# Patient Record
Sex: Female | Born: 1985 | Race: White | Hispanic: No | Marital: Single | Smoking: Current every day smoker
Health system: Southern US, Community
[De-identification: ages and names within clinical notes are randomized; demographics above are authoritative.]

---
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
## 2015-09-27 ENCOUNTER — Emergency Department (HOSPITAL_BASED_OUTPATIENT_CLINIC_OR_DEPARTMENT_OTHER): Payer: Self-pay

## 2015-09-27 ENCOUNTER — Encounter (HOSPITAL_BASED_OUTPATIENT_CLINIC_OR_DEPARTMENT_OTHER): Payer: Self-pay | Admitting: *Deleted

## 2015-09-27 ENCOUNTER — Emergency Department (HOSPITAL_BASED_OUTPATIENT_CLINIC_OR_DEPARTMENT_OTHER)
Admission: EM | Admit: 2015-09-27 | Discharge: 2015-09-27 | Disposition: A | Discharge status: Home / Self Care | Payer: Self-pay | Attending: Emergency Medicine | Admitting: Emergency Medicine

## 2015-09-27 DIAGNOSIS — N760 Acute vaginitis: Secondary | ICD-10-CM | POA: Insufficient documentation

## 2015-09-27 DIAGNOSIS — N390 Urinary tract infection, site not specified: Secondary | ICD-10-CM | POA: Insufficient documentation

## 2015-09-27 DIAGNOSIS — B9689 Other specified bacterial agents as the cause of diseases classified elsewhere: Secondary | ICD-10-CM

## 2015-09-27 DIAGNOSIS — R102 Pelvic and perineal pain: Secondary | ICD-10-CM

## 2015-09-27 DIAGNOSIS — F17210 Nicotine dependence, cigarettes, uncomplicated: Secondary | ICD-10-CM | POA: Insufficient documentation

## 2015-09-27 LAB — URINALYSIS, ROUTINE W REFLEX MICROSCOPIC
Bilirubin Urine: NEGATIVE
GLUCOSE, UA: NEGATIVE mg/dL
KETONES UR: NEGATIVE mg/dL
Nitrite: POSITIVE — AB
PH: 6.5 (ref 5.0–8.0)
PROTEIN: NEGATIVE mg/dL
Specific Gravity, Urine: 1.027 (ref 1.005–1.030)

## 2015-09-27 LAB — COMPREHENSIVE METABOLIC PANEL
ALK PHOS: 46 U/L (ref 38–126)
ALT: 9 U/L — AB (ref 14–54)
AST: 14 U/L — ABNORMAL LOW (ref 15–41)
Albumin: 4.1 g/dL (ref 3.5–5.0)
Anion gap: 6 (ref 5–15)
BILIRUBIN TOTAL: 0.4 mg/dL (ref 0.3–1.2)
BUN: 11 mg/dL (ref 6–20)
CALCIUM: 9.1 mg/dL (ref 8.9–10.3)
CHLORIDE: 107 mmol/L (ref 101–111)
CO2: 23 mmol/L (ref 22–32)
CREATININE: 0.59 mg/dL (ref 0.44–1.00)
Glucose, Bld: 91 mg/dL (ref 65–99)
Potassium: 3.8 mmol/L (ref 3.5–5.1)
Sodium: 136 mmol/L (ref 135–145)
TOTAL PROTEIN: 6.7 g/dL (ref 6.5–8.1)

## 2015-09-27 LAB — CBC WITH DIFFERENTIAL/PLATELET
Basophils Absolute: 0 10*3/uL (ref 0.0–0.1)
Basophils Relative: 0 %
EOS PCT: 1 %
Eosinophils Absolute: 0.1 10*3/uL (ref 0.0–0.7)
HEMATOCRIT: 39.2 % (ref 36.0–46.0)
Hemoglobin: 13.7 g/dL (ref 12.0–15.0)
LYMPHS ABS: 2.2 10*3/uL (ref 0.7–4.0)
LYMPHS PCT: 17 %
MCH: 34.3 pg — AB (ref 26.0–34.0)
MCHC: 34.9 g/dL (ref 30.0–36.0)
MCV: 98.2 fL (ref 78.0–100.0)
Monocytes Absolute: 0.9 10*3/uL (ref 0.1–1.0)
Monocytes Relative: 7 %
Neutro Abs: 9.5 10*3/uL — ABNORMAL HIGH (ref 1.7–7.7)
Neutrophils Relative %: 75 %
PLATELETS: 256 10*3/uL (ref 150–400)
RBC: 3.99 MIL/uL (ref 3.87–5.11)
RDW: 11.9 % (ref 11.5–15.5)
WBC: 12.7 10*3/uL — AB (ref 4.0–10.5)

## 2015-09-27 LAB — WET PREP, GENITAL
Sperm: NONE SEEN
Trich, Wet Prep: NONE SEEN
YEAST WET PREP: NONE SEEN

## 2015-09-27 LAB — URINE MICROSCOPIC-ADD ON

## 2015-09-27 LAB — LIPASE, BLOOD: LIPASE: 34 U/L (ref 11–51)

## 2015-09-27 LAB — PREGNANCY, URINE: Preg Test, Ur: NEGATIVE

## 2015-09-27 MED ORDER — METRONIDAZOLE 500 MG PO TABS: 500.0000 mg | ORAL_TABLET | Freq: Two times a day (BID) | ORAL | 0 refills | Status: AC

## 2015-09-27 MED ORDER — PHENAZOPYRIDINE HCL 200 MG PO TABS: 200.0000 mg | ORAL_TABLET | Freq: Three times a day (TID) | ORAL | 0 refills | Status: AC

## 2015-09-27 MED ORDER — CEPHALEXIN 500 MG PO CAPS: 500.0000 mg | ORAL_CAPSULE | Freq: Two times a day (BID) | ORAL | 0 refills | Status: AC

## 2015-09-27 NOTE — ED Provider Notes (Signed)
[facility] Provider Note   CSN: [id number] Arrival date & time: 09/27/15  1916   By signing my name below, I, Christel Mormon, attest that this documentation has been prepared under the direction and in the presence of Melburn Hake, [place]. Electronically Signed: Christel Mormon, Scribe. 09/27/2015. 8:00 PM.   History   Chief Complaint Chief Complaint  Patient presents with  . Abdominal Pain    HPI Kristy Farmer is a 30 y.o. female.  The history is provided by the patient. No language interpreter was used.  HPI Comments:  Kristy Farmer is a 30 y.o. female with PMHx of UTI and yeast infections and PSHx of caesarian section who presents to the Emergency Department complaining of sudden onset, constant, sharp lower abdominal pain x 1 day. Pt complains of associated constipation, vaginal discharge, and dysuria. LNMP was 2 weeks ago. Pt took motrin with some relief but pain has since worsened. Pt notes that she is sexually active with 1 partner and does not uses condoms every time. Pt denies any previous episodes of similar pain. Pt denies fever, nausea, vomiting, diarrhea, constipation and hematuria, flank pain, vaginal bleeding.  History reviewed. No pertinent past medical history.  There are no active problems to display for this patient.   Past Surgical History:  Procedure Laterality Date  . CESAREAN SECTION      OB History    No data available       Home Medications    Prior to Admission medications   Medication Sig Start Date End Date Taking? Authorizing Provider  cephALEXin (KEFLEX) 500 MG capsule Take 1 capsule (500 mg total) by mouth 2 (two) times daily. 09/27/15 10/04/15  Barrett Henle, PA-C  metroNIDAZOLE (FLAGYL) 500 MG tablet Take 1 tablet (500 mg total) by mouth 2 (two) times daily. 09/27/15   Barrett Henle, PA-C  phenazopyridine (PYRIDIUM) 200 MG tablet Take 1 tablet (200 mg total) by mouth 3 (three) times daily. 09/27/15   Barrett Henle, PA-C    Family History No family history on file.  Social History Social History  Substance Use Topics  . Smoking status: Current Every Day Smoker    Packs/day: 0.50    Types: Cigarettes  . Smokeless tobacco: Never Used  . Alcohol use Yes     Allergies   Review of patient's allergies indicates no known allergies.   Review of Systems Review of Systems  Constitutional: Negative for fever.  Gastrointestinal: Positive for abdominal pain. Negative for diarrhea, nausea and vomiting.  Genitourinary: Positive for dysuria and vaginal discharge. Negative for hematuria.  All other systems reviewed and are negative.    Physical Exam Updated Vital Signs BP 106/69 (BP Location: Right Arm)   Pulse 71   Temp 98.5 F (36.9 C) (Oral)   Resp 18   Ht 5\' 2"  (1.575 m)   Wt 49.9 kg   LMP 09/13/2015   SpO2 100%   BMI 20.12 kg/m   Physical Exam  Constitutional: She is oriented to person, place, and time. She appears well-developed and well-nourished. No distress.  HENT:  Head: Normocephalic and atraumatic.  Mouth/Throat: Oropharynx is clear and moist. No oropharyngeal exudate.  Eyes: Conjunctivae and EOM are normal. Right eye exhibits no discharge. Left eye exhibits no discharge. No scleral icterus.  Neck: Normal range of motion. Neck supple.  Cardiovascular: Normal rate, regular rhythm, normal heart sounds and intact distal pulses.   No murmur heard. Pulmonary/Chest: Effort normal and breath sounds normal. No respiratory distress.  She has no wheezes. She has no rales. She exhibits no tenderness.  Abdominal: Soft. Bowel sounds are normal. She exhibits no distension and no mass. There is tenderness. There is no rebound and no guarding. No hernia.  Pt reports mild discomfort with palpation over R and L LQ No CVA tenderness  Genitourinary:  Genitourinary Comments: Mild tenderness to lateral edges of mons pubis. No swelling, erythema, warmth or drainage notes.     Musculoskeletal: She exhibits no edema.  Neurological: She is alert and oriented to person, place, and time.  Skin: Skin is warm and dry.  Psychiatric: She has a normal mood and affect.  Nursing note and vitals reviewed.  Pelvic exam: normal external genitalia, vulva, vagina, cervix, uterus and adnexa, VULVA: normal appearing vulva with no masses, tenderness or lesions, VAGINA: normal appearing vagina with normal color and discharge, no lesions, vaginal discharge - white and malodorous, CERVIX: normal appearing cervix without discharge or lesions, WET MOUNT done - results: clue cells, white blood cells, DNA probe for chlamydia and GC obtained, UTERUS: uterus is normal size, shape, consistency and nontender, ADNEXA: normal adnexa in size, nontender and no masses, tenderness left, exam chaperoned by female tech.   ED Treatments / Results  DIAGNOSTIC STUDIES:  Oxygen Saturation is 100% on RA, normal by my interpretation.    COORDINATION OF CARE:  11:08 PM Discussed treatment plan with pt at bedside and pt agreed to plan.  Labs (all labs ordered are listed, but only abnormal results are displayed) Labs Reviewed  WET PREP, GENITAL - Abnormal; Notable for the following:       Result Value   Clue Cells Wet Prep HPF POC PRESENT (*)    WBC, Wet Prep HPF POC MANY (*)    All other components within normal limits  URINALYSIS, ROUTINE W REFLEX MICROSCOPIC (NOT AT [facility]) - Abnormal; Notable for the following:    APPearance CLOUDY (*)    Hgb urine dipstick SMALL (*)    Nitrite POSITIVE (*)    Leukocytes, UA MODERATE (*)    All other components within normal limits  URINE MICROSCOPIC-ADD ON - Abnormal; Notable for the following:    Squamous Epithelial / LPF 6-30 (*)    Bacteria, UA MANY (*)    All other components within normal limits  CBC WITH DIFFERENTIAL/PLATELET - Abnormal; Notable for the following:    WBC 12.7 (*)    MCH 34.3 (*)    Neutro Abs 9.5 (*)    All other components within  normal limits  COMPREHENSIVE METABOLIC PANEL - Abnormal; Notable for the following:    AST 14 (*)    ALT 9 (*)    All other components within normal limits  PREGNANCY, URINE  LIPASE, BLOOD  RPR  HIV ANTIBODY (ROUTINE TESTING)  GC/CHLAMYDIA PROBE AMP (Advance) NOT AT [facility]    EKG  EKG Interpretation None       Radiology US Transvaginal Non-ob  Result Date: 09/27/2015 CLINICAL DATA:  Bilateral adnexa pain x2 days, left greater than right EXAM: TRANSABDOMINAL AND TRANSVAGINAL ULTRASOUND OF PELVIS DOPPLER ULTRASOUND OF OVARIES TECHNIQUE: Both transabdominal and transvaginal ultrasound examinations of the pelvis were performed. Transabdominal technique was performed for global imaging of the pelvis including uterus, ovaries, adnexal regions, and pelvic cul-de-sac. It was necessary to proceed with endovaginal exam following the transabdominal exam to visualize the endometrium. Color and duplex Doppler ultrasound was utilized to evaluate blood flow to the ovaries. COMPARISON:  None. FINDINGS: Uterus Measurements: 8.0 x 3.7  x 4.5 cm. No fibroids or other mass visualized. Endometrium Thickness: 9 mm.  No focal abnormality visualized. Right ovary Measurements: 3.6 x 2.8 x 3.5 cm. Normal appearance/no adnexal mass. Left ovary Measurements: 3.1 x 1.8 x 2.1 cm. Normal appearance/no adnexal mass. Pulsed Doppler evaluation of both ovaries demonstrates normal low-resistance arterial and venous waveforms. Other findings Trace pelvic fluid, likely physiologic. IMPRESSION: Negative pelvic ultrasound. No evidence of ovarian torsion. Electronically Signed   By: Charline Bills M.D.   On: 09/27/2015 22:36   US Pelvis Complete  Result Date: 09/27/2015 CLINICAL DATA:  Bilateral adnexa pain x2 days, left greater than right EXAM: TRANSABDOMINAL AND TRANSVAGINAL ULTRASOUND OF PELVIS DOPPLER ULTRASOUND OF OVARIES TECHNIQUE: Both transabdominal and transvaginal ultrasound examinations of the pelvis were performed.  Transabdominal technique was performed for global imaging of the pelvis including uterus, ovaries, adnexal regions, and pelvic cul-de-sac. It was necessary to proceed with endovaginal exam following the transabdominal exam to visualize the endometrium. Color and duplex Doppler ultrasound was utilized to evaluate blood flow to the ovaries. COMPARISON:  None. FINDINGS: Uterus Measurements: 8.0 x 3.7 x 4.5 cm. No fibroids or other mass visualized. Endometrium Thickness: 9 mm.  No focal abnormality visualized. Right ovary Measurements: 3.6 x 2.8 x 3.5 cm. Normal appearance/no adnexal mass. Left ovary Measurements: 3.1 x 1.8 x 2.1 cm. Normal appearance/no adnexal mass. Pulsed Doppler evaluation of both ovaries demonstrates normal low-resistance arterial and venous waveforms. Other findings Trace pelvic fluid, likely physiologic. IMPRESSION: Negative pelvic ultrasound. No evidence of ovarian torsion. Electronically Signed   By: Charline Bills M.D.   On: 09/27/2015 22:36   [place] Art/ven Flow Abd Pelv Doppler  Result Date: 09/27/2015 CLINICAL DATA:  Bilateral adnexa pain x2 days, left greater than right EXAM: TRANSABDOMINAL AND TRANSVAGINAL ULTRASOUND OF PELVIS DOPPLER ULTRASOUND OF OVARIES TECHNIQUE: Both transabdominal and transvaginal ultrasound examinations of the pelvis were performed. Transabdominal technique was performed for global imaging of the pelvis including uterus, ovaries, adnexal regions, and pelvic cul-de-sac. It was necessary to proceed with endovaginal exam following the transabdominal exam to visualize the endometrium. Color and duplex Doppler ultrasound was utilized to evaluate blood flow to the ovaries. COMPARISON:  None. FINDINGS: Uterus Measurements: 8.0 x 3.7 x 4.5 cm. No fibroids or other mass visualized. Endometrium Thickness: 9 mm.  No focal abnormality visualized. Right ovary Measurements: 3.6 x 2.8 x 3.5 cm. Normal appearance/no adnexal mass. Left ovary Measurements: 3.1 x 1.8 x 2.1 cm.  Normal appearance/no adnexal mass. Pulsed Doppler evaluation of both ovaries demonstrates normal low-resistance arterial and venous waveforms. Other findings Trace pelvic fluid, likely physiologic. IMPRESSION: Negative pelvic ultrasound. No evidence of ovarian torsion. Electronically Signed   By: Charline Bills M.D.   On: 09/27/2015 22:36    Procedures Procedures (including critical care time)  Medications Ordered in ED Medications - No data to display   Initial Impression / Assessment and Plan / ED Course  I have reviewed the triage vital signs and the nursing notes.  Pertinent labs & imaging results that were available during my care of the patient were reviewed by me and considered in my medical decision making (see chart for details).  Clinical Course    Patient presents with lower abdominal pain, dysuria and vaginal discharge that has been present for the past 2 days. Denies fever or vomiting. VSS. Exam revealed tenderness in right and left lower quadrants, no peritoneal signs. Pelvic exam revealed left adnexal tenderness and white malodorous discharge in vaginal vault, no CMT. Patient declined  any pain medications.  Pregnancy negative. WBC 12.7. UA consistent with UTI. Wet prep positive for clue cells and WBCs. Due to patient having left adnexal tenderness on exam will order a pelvic ultrasound for further evaluation and concern for ovarian torsion. Pelvic ultrasound and Doppler unremarkable. On reevaluation patient is resting comfortably in room and denies any pain or complaints at this time. Suspect patient's symptoms are likely due to her UTI and BV and do not feel any further workup or imaging is warranted at this time. Plan to discharge patient home with Flagyl to treat BV and Keflex for UTI. Advised patient about her pending labs for chlamydia, gonorrhea, HIV and syphilis. Advised patient to follow up with PCP as needed. Discussed strict return precautions with patient.  Final  Clinical Impressions(s) / ED Diagnoses   Final diagnoses:  Left adnexal tenderness  UTI (lower urinary tract infection)  BV (bacterial vaginosis)    New Prescriptions New Prescriptions   CEPHALEXIN (KEFLEX) 500 MG CAPSULE    Take 1 capsule (500 mg total) by mouth 2 (two) times daily.   METRONIDAZOLE (FLAGYL) 500 MG TABLET    Take 1 tablet (500 mg total) by mouth 2 (two) times daily.   PHENAZOPYRIDINE (PYRIDIUM) 200 MG TABLET    Take 1 tablet (200 mg total) by mouth 3 (three) times daily.   I personally performed the services described in this documentation, which was scribed in my presence. The recorded information has been reviewed and is accurate.     Satira Sarkicole Elizabeth MillstoneNadeau, [place]-C 09/27/15 2313    Jacalyn LefevreJulie Haviland, MD 10/02/15 [phone]

## 2015-09-27 NOTE — Discharge Instructions (Signed)
Take Benadryl antibiotics as prescribed until completed. Continue drinking fluids at home determining hydrated. Please follow up with a primary care provider from the Resource Guide provided below in the next week if your symptoms are not improved. Please return to the Emergency Department if symptoms worsen or new onset of fever, new/worsening abdominal pain, vomiting, unable to keep fluids down, blood in urine or stool, difficulty urinating, flank pain.

## 2015-09-27 NOTE — ED Triage Notes (Signed)
Lower abdominal pain. Dysuria.

## 2015-09-29 LAB — HIV ANTIBODY (ROUTINE TESTING W REFLEX): HIV Screen 4th Generation wRfx: NONREACTIVE

## 2015-09-29 LAB — RPR: RPR: NONREACTIVE

## 2015-10-01 LAB — GC/CHLAMYDIA PROBE AMP (~~LOC~~) NOT AT ARMC
CHLAMYDIA, DNA PROBE: POSITIVE — AB
Neisseria Gonorrhea: NEGATIVE

## 2015-10-02 ENCOUNTER — Telehealth (HOSPITAL_BASED_OUTPATIENT_CLINIC_OR_DEPARTMENT_OTHER): Payer: Self-pay | Admitting: Emergency Medicine

## 2015-10-02 NOTE — Telephone Encounter (Signed)
Post ED Visit - Positive Culture Follow-up: Successful Patient Follow-Up  Culture assessed and recommendations reviewed by: []  Enzo BiNathan Batchelder, Pharm.D. []  Celedonio MiyamotoJeremy Frens, Pharm.D., BCPS []  Garvin FilaMike Maccia, Pharm.D. []  Georgina PillionElizabeth Martin, Pharm.D., BCPS []  RosedaleMinh Pham, [address] Rainbow BoulevardPharm.D., BCPS, AAHIVP []  Estella HuskMichelle Turner, Pharm.D., BCPS, AAHIVP []  Tennis Mustassie Stewart, Pharm.D. []  Sherle Poeob Vincent, [place]D.  Positive Chlamydia culture  [x]  Patient discharged without antimicrobial prescription and treatment is now indicated []  Organism is resistant to prescribed ED discharge antimicrobial []  Patient with positive blood cultures  Changes discussed with ED provider: Sharilyn SitesLisa Sanders PA New antibiotic prescription Start Azithromycin 1000mg  po x [facility]  Contacted patient, 10/02/15 1637   Berle MullMiller, Kristy Mcpheeters 10/02/2015, 4:49 PM

## 2015-10-02 NOTE — Telephone Encounter (Signed)
Chart handoff to EDP for treatment plan for + Chlamydia 

## 2015-12-20 ENCOUNTER — Encounter (HOSPITAL_BASED_OUTPATIENT_CLINIC_OR_DEPARTMENT_OTHER): Payer: Self-pay | Admitting: *Deleted

## 2015-12-20 ENCOUNTER — Emergency Department (HOSPITAL_BASED_OUTPATIENT_CLINIC_OR_DEPARTMENT_OTHER)
Admission: EM | Admit: 2015-12-20 | Discharge: 2015-12-20 | Disposition: A | Discharge status: Home / Self Care | Payer: Self-pay | Attending: Emergency Medicine | Admitting: Emergency Medicine

## 2015-12-20 DIAGNOSIS — R05 Cough: Secondary | ICD-10-CM | POA: Insufficient documentation

## 2015-12-20 DIAGNOSIS — F17210 Nicotine dependence, cigarettes, uncomplicated: Secondary | ICD-10-CM | POA: Insufficient documentation

## 2015-12-20 DIAGNOSIS — L03211 Cellulitis of face: Secondary | ICD-10-CM | POA: Insufficient documentation

## 2015-12-20 MED ORDER — CEPHALEXIN 500 MG PO CAPS: ORAL_CAPSULE | ORAL | 0 refills | Status: AC

## 2015-12-20 NOTE — ED Provider Notes (Signed)
[facility]   CSN: [id number] Arrival date & time: 12/20/15  1829  By signing my name below, I, Emmanuella Mensah, attest that this documentation has been prepared under the direction and in the presence of Fayrene HelperBowie Antonieta Slaven, PA-C. Electronically Signed: Angelene GiovanniEmmanuella Mensah, ED Scribe. 12/20/15. 8:18 PM.   History   Chief Complaint Chief Complaint  Patient presents with  . Cellulitis    HPI Comments: Kristy Farmer is a 30 y.o. female who presents to the Emergency Department complaining of gradually worsening moderately itchy area of swelling and redness to her right face onset yesterday. She reports associated subjective fever. She notes that her symptoms began as right ear pain with discharge and the ear is beginning to start on the left side of her face. She states that when she lays down at night, her ears are "clogged" and she has difficulty hearing appropriately. No alleviating factors noted. She states that she has tried Motrin and her mother gave her an ear drop with numbing medication with no relief. She also states that she uses Q-tips frequently to clean out her ears. She has NKDA. She explains that the last time she had these symptoms, she had an ear pain as well; she states that her last episode was 6 months ago. She adds that she has been coughing and sneezing due to her hx of seasonal allergies. She denies any nausea, vomiting, hearing changes, sore throat, generalized rash, or any other symptoms.   The history is provided by the patient. No language interpreter was used.    History reviewed. No pertinent past medical history.  There are no active problems to display for this patient.   Past Surgical History:  Procedure Laterality Date  . CESAREAN SECTION      OB History    No data available       Home Medications    Prior to Admission medications   Medication Sig Start Date End Date Taking? Authorizing Provider  metroNIDAZOLE (FLAGYL) 500 MG  tablet Take 1 tablet (500 mg total) by mouth 2 (two) times daily. 09/27/15   Barrett HenleNicole Elizabeth Nadeau, PA-C  phenazopyridine (PYRIDIUM) 200 MG tablet Take 1 tablet (200 mg total) by mouth 3 (three) times daily. 09/27/15   Barrett HenleNicole Elizabeth Nadeau, PA-C    Family History No family history on file.  Social History Social History  Substance Use Topics  . Smoking status: Current Every Day Smoker    Packs/day: 0.50    Types: Cigarettes  . Smokeless tobacco: Never Used  . Alcohol use Yes     Allergies   Patient has no known allergies.   Review of Systems Review of Systems  Constitutional: Positive for fever.  HENT: Positive for ear discharge, ear pain, facial swelling and sneezing. Negative for hearing loss and sore throat.   Respiratory: Positive for cough.   Gastrointestinal: Negative for nausea and vomiting.  Skin: Positive for color change. Negative for rash.  All other systems reviewed and are negative.    Physical Exam Updated Vital Signs BP 120/71   Pulse 104   Temp 98.3 F (36.8 C) (Oral)   Resp 18   Ht 5\' 2"  (1.575 m)   Wt 112 lb (50.8 kg)   LMP 12/20/2015   SpO2 100%   BMI 20.49 kg/m   Physical Exam  Constitutional: She is oriented to person, place, and time. She appears well-developed and well-nourished.  Non toxic in appearance.  HENT:  Head: Normocephalic and atraumatic.  Mouth/Throat: Uvula  is midline and oropharynx is clear and moist. No trismus in the jaw. No oropharyngeal exudate or posterior oropharyngeal erythema.  Bilateral ear canal are edematous  TM is difficult to fully visual but they do not appear erythematous  Right side of face is erythematous,edematous, and warm to the touch; TTP without any obvious abscess   Eyes: EOM are normal.  EOM intact  Neck:  No nuchal rigidity   Cardiovascular: Normal rate, regular rhythm and normal heart sounds.   Pulmonary/Chest: Effort normal and breath sounds normal.  Lymphadenopathy:    She has no cervical  adenopathy.  Neurological: She is alert and oriented to person, place, and time.  Skin: Skin is warm and dry.  Psychiatric: She has a normal mood and affect.  Nursing note and vitals reviewed.    ED Treatments / Results  DIAGNOSTIC STUDIES: Oxygen Saturation is 100% on RA, normal by my interpretation.    COORDINATION OF CARE: 8:17 PM- Pt advised of plan for treatment and pt agrees. Advised to control with Motrin for pain. Will provide resources for ENT follow up.    Labs (all labs ordered are listed, but only abnormal results are displayed) Labs Reviewed - No data to display  EKG  EKG Interpretation None       Radiology No results found.  Procedures Procedures (including critical care time)  Medications Ordered in ED Medications - No data to display   Initial Impression / Assessment and Plan / ED Course  Fayrene HelperBowie Imoni Kohen, PA-C has reviewed the triage vital signs and the nursing notes.  Pertinent labs & imaging results that were available during my care of the patient were reviewed by me and considered in my medical decision making (see chart for details).  Clinical Course    BP 120/71   Pulse 104   Temp 98.3 F (36.8 C) (Oral)   Resp 18   Ht 5\' 2"  (1.575 m)   Wt 50.8 kg   LMP 12/20/2015   SpO2 100%   BMI 20.49 kg/m      Final Clinical Impressions(s) / ED Diagnoses   Final diagnoses:  Diffuse cellulitis of face    New Prescriptions New Prescriptions   CEPHALEXIN (KEFLEX) 500 MG CAPSULE    2 caps po bid x 7 days   I personally performed the services described in this documentation, which was scribed in my presence. The recorded information has been reviewed and is accurate.   Pt presents with R side facial redness consistent with cellulitis, likely 2/2 using Q-tip to clean her ears on a regular basis.  Her ear canals are erythematous and mildly edematous.  TM difficult to fully visualized but appears normal.  Evidence of mild preseptal cellulitis but no  orbital cellulitis as there are no pain with eye movement.  Keflex prescribed for infection.  Return in 48 hrs for wound recheck.  ENT referral given.     Fayrene HelperBowie Washington Whedbee, PA-C 12/20/15 2028    Vanetta MuldersScott Zackowski, MD 12/22/15 [phone]

## 2015-12-20 NOTE — Discharge Instructions (Signed)
Please avoid using Q-tip as it increases risk of facial or ear infection.  Take antibiotic as prescribed for the full duration.  Return in 48 hrs for recheck if you notice no improvement.  Take tylenol or ibuprofen as needed for pain.  Follow up with ENT specialist for further management of your ears.

## 2015-12-20 NOTE — ED Notes (Addendum)
Pt seen by EDPA prior to RN assessment, see MD notes, orders received to d/c. Pt amiable, alert, NAD, calm, interactive, resps e/u, speaking in clear complete sentences, no dyspnea noted, [facility], steady gait to b/r.

## 2015-12-20 NOTE — ED Triage Notes (Signed)
Pain in her right ear. Swelling, redness and pain to her face since yesterday. Now the pain, redness and swelling is going to the left side of her face and ear.

## 2018-04-30 IMAGING — US US TRANSVAGINAL NON-OB
1 series · 13 of 25 positions shown · non-contrast
Comparison: None.

CLINICAL DATA: Bilateral adnexa pain x2 days, left greater than
right

EXAM:
TRANSABDOMINAL AND TRANSVAGINAL ULTRASOUND OF PELVIS
DOPPLER ULTRASOUND OF OVARIES
TECHNIQUE: Both transabdominal and transvaginal ultrasound examinations of the
pelvis were performed. Transabdominal technique was performed for
global imaging of the pelvis including uterus, ovaries, adnexal
regions, and pelvic cul-de-sac.
It was necessary to proceed with endovaginal exam following the
transabdominal exam to visualize the endometrium. Color and duplex
Doppler ultrasound was utilized to evaluate blood flow to the
ovaries.

[Series 1: us transvaginal non-ob · 0.18mm/px · 13 of 44 slices shown]
[im 1/44]
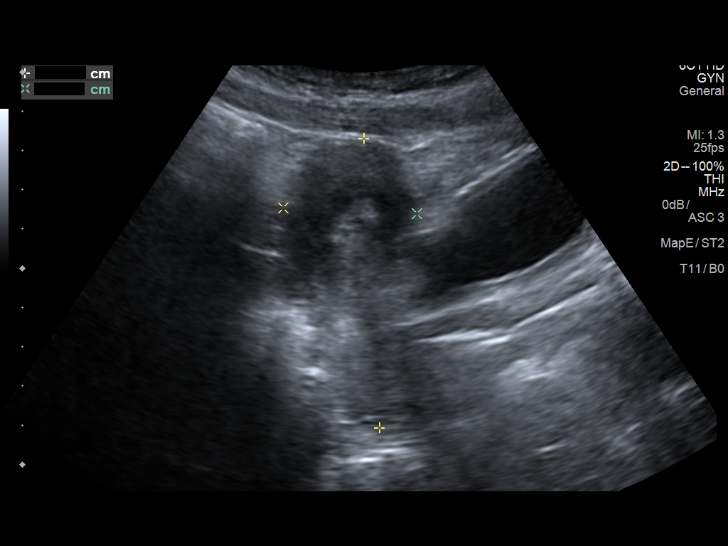
[im 4/44]
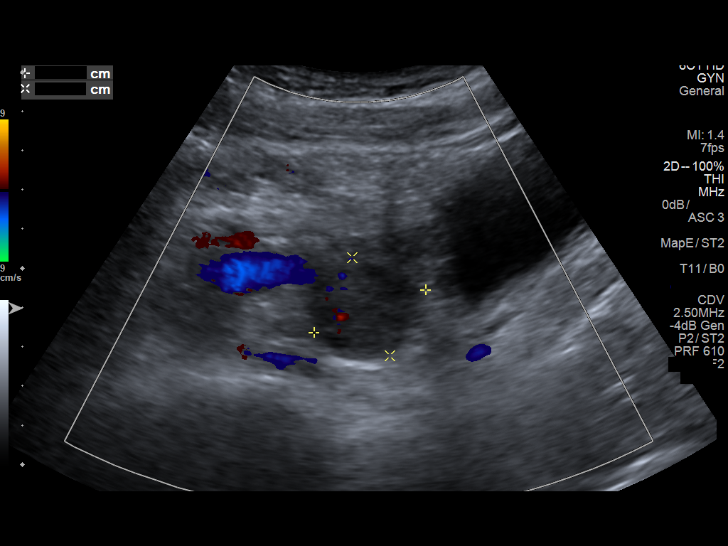
[im 8/44]
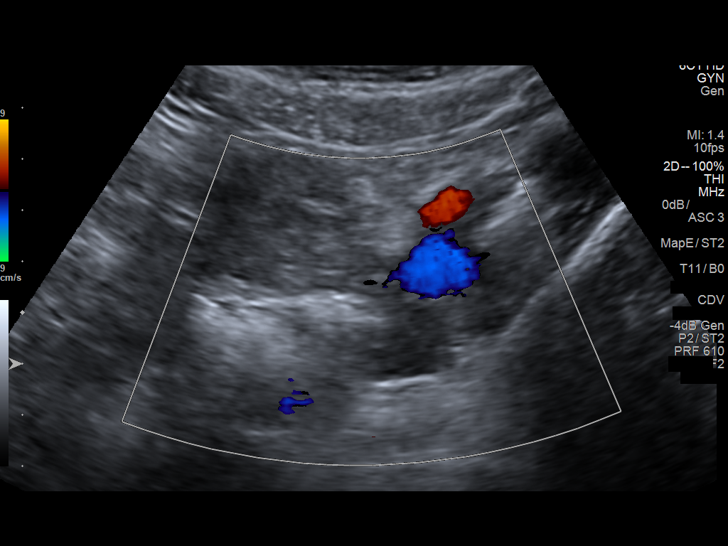
[im 11/44]
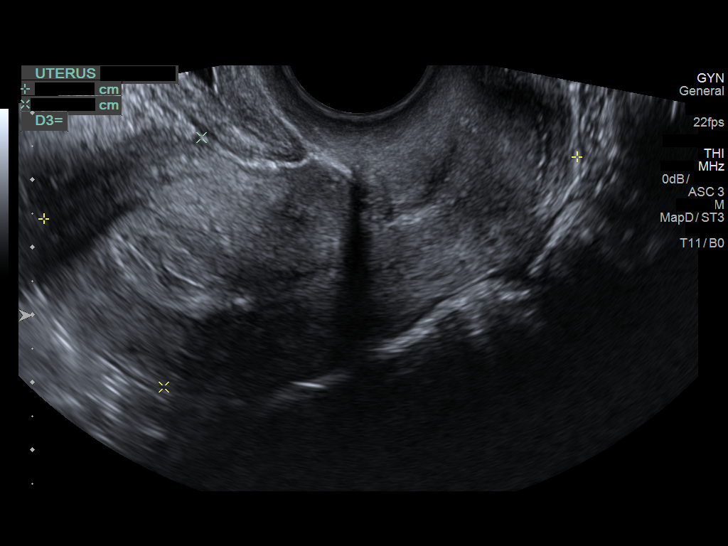
[im 15/44]
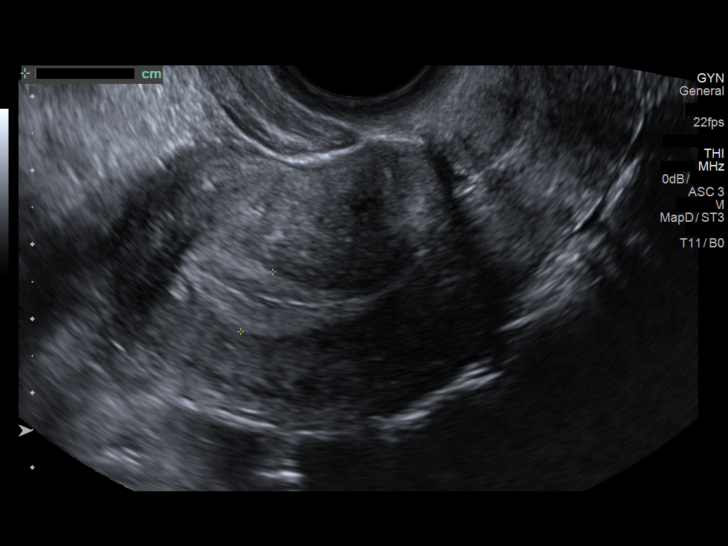
[im 18/44]
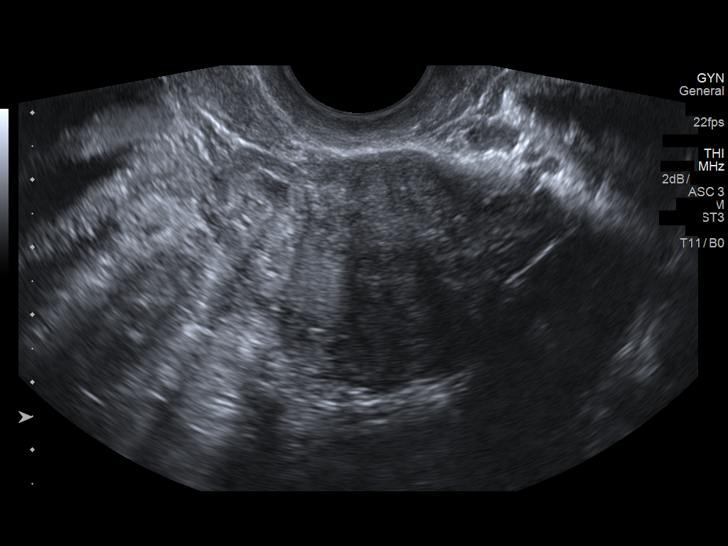
[im 22/44]
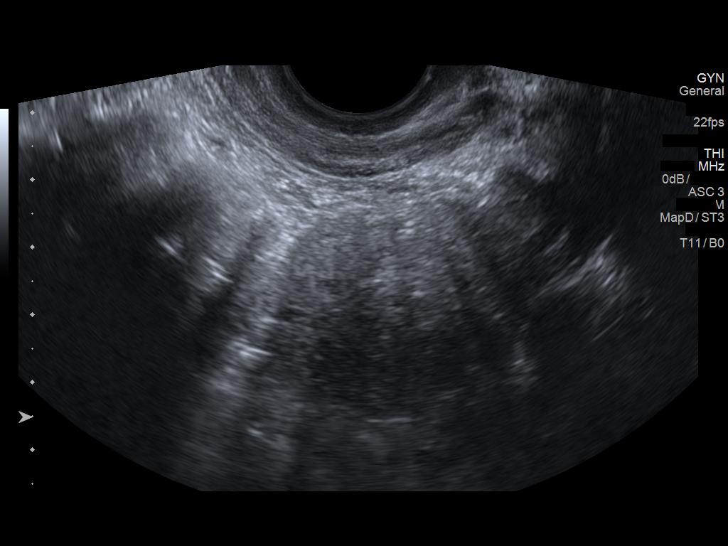
[im 26/44]
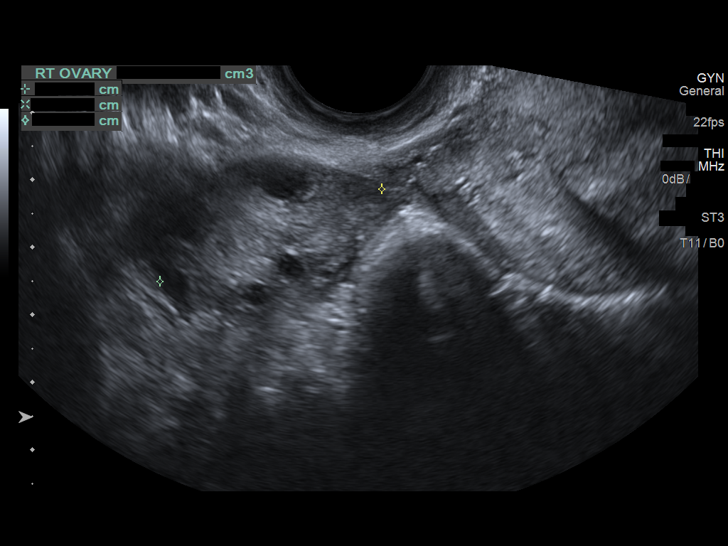
[im 29/44]
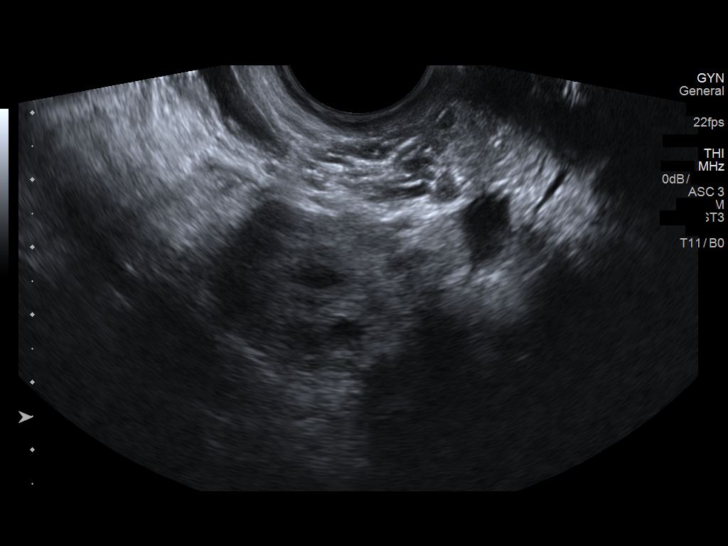
[im 33/44]
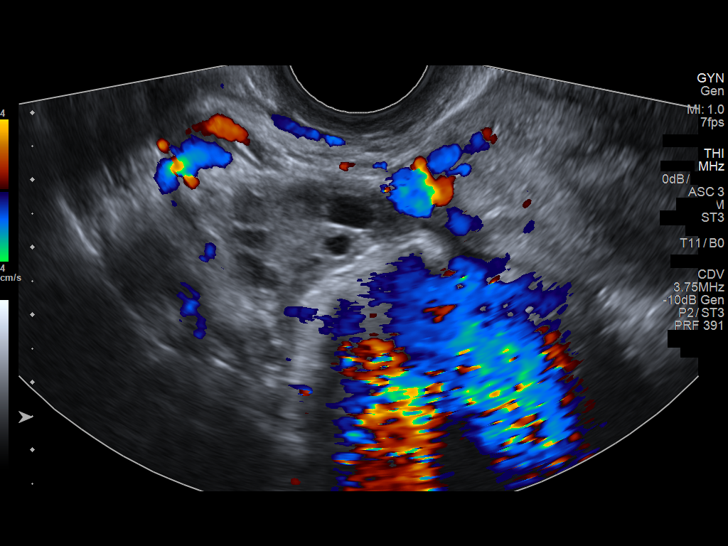
[im 36/44]
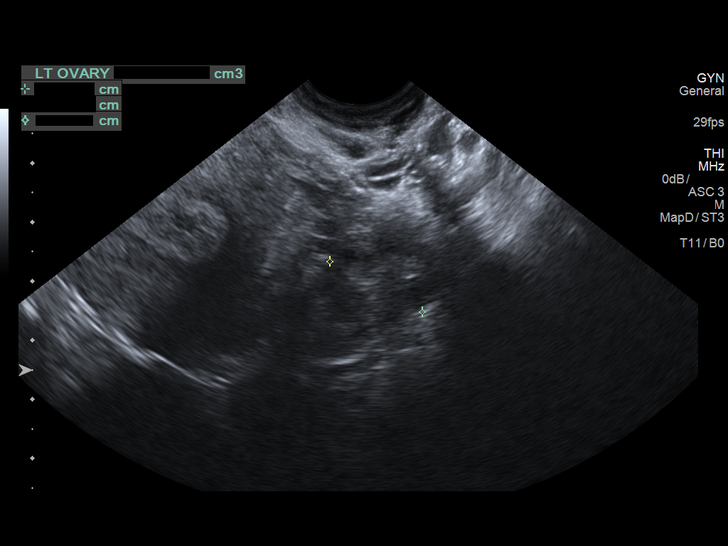
[im 40/44]
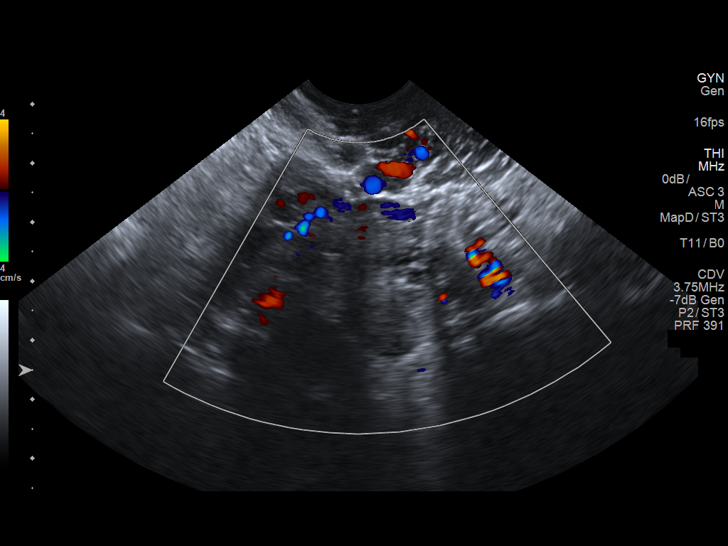
[im 44/44]
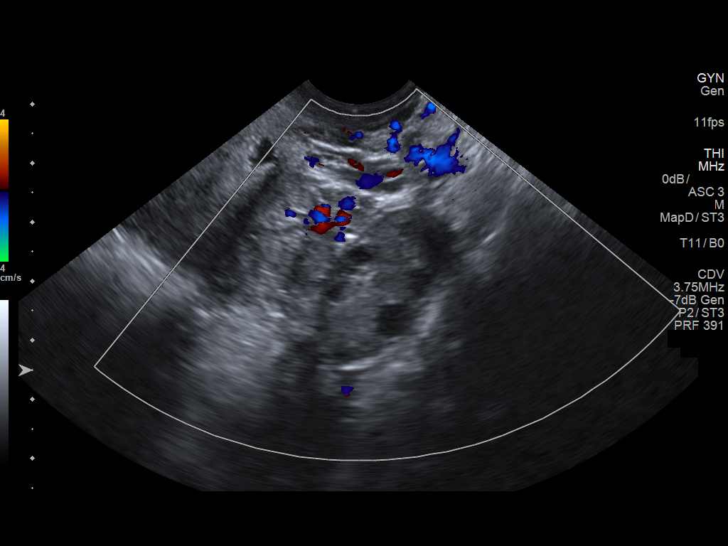

[13 of 25 positions shown; findings below may reference images not displayed]

FINDINGS: Uterus

Measurements: 8.0 x 3.7 x 4.5 cm. No fibroids or other mass
visualized.

Endometrium

Thickness: 9 mm.  No focal abnormality visualized.

Right ovary

Measurements: 3.6 x 2.8 x 3.5 cm. Normal appearance/no adnexal mass.

Left ovary

Measurements: 3.1 x 1.8 x 2.1 cm. Normal appearance/no adnexal mass.

Pulsed Doppler evaluation of both ovaries demonstrates normal
low-resistance arterial and venous waveforms.

Other findings

Trace pelvic fluid, likely physiologic.
IMPRESSION: Negative pelvic ultrasound.

No evidence of ovarian torsion.
